# Patient Record
Sex: Male | Born: 1985 | Race: White | Hispanic: No | Marital: Single | Smoking: Current every day smoker
Health system: Southern US, Community
[De-identification: ages and names within clinical notes are randomized; demographics above are authoritative.]

## PROBLEM LIST (undated history)

## (undated) DIAGNOSIS — G43909 Migraine, unspecified, not intractable, without status migrainosus: Secondary | ICD-10-CM

---

## 2017-01-07 ENCOUNTER — Emergency Department: Payer: Self-pay

## 2017-01-07 ENCOUNTER — Encounter: Payer: Self-pay | Admitting: Emergency Medicine

## 2017-01-07 ENCOUNTER — Emergency Department
Admission: EM | Admit: 2017-01-07 | Discharge: 2017-01-07 | Disposition: A | Discharge status: Home / Self Care | Payer: Self-pay | Attending: Emergency Medicine | Admitting: Emergency Medicine

## 2017-01-07 ENCOUNTER — Other Ambulatory Visit: Payer: Self-pay

## 2017-01-07 DIAGNOSIS — R42 Dizziness and giddiness: Secondary | ICD-10-CM | POA: Insufficient documentation

## 2017-01-07 DIAGNOSIS — F17210 Nicotine dependence, cigarettes, uncomplicated: Secondary | ICD-10-CM | POA: Insufficient documentation

## 2017-01-07 DIAGNOSIS — J189 Pneumonia, unspecified organism: Secondary | ICD-10-CM | POA: Insufficient documentation

## 2017-01-07 DIAGNOSIS — R0789 Other chest pain: Secondary | ICD-10-CM

## 2017-01-07 HISTORY — DX: Migraine, unspecified, not intractable, without status migrainosus: G43.909

## 2017-01-07 LAB — BASIC METABOLIC PANEL
Anion gap: 9 (ref 5–15)
BUN: 16 mg/dL (ref 6–20)
CHLORIDE: 101 mmol/L (ref 101–111)
CO2: 28 mmol/L (ref 22–32)
CREATININE: 0.97 mg/dL (ref 0.61–1.24)
Calcium: 8.4 mg/dL — ABNORMAL LOW (ref 8.9–10.3)
GFR calc Af Amer: >60 mL/min (ref >60)
GFR calc non Af Amer: >60 mL/min (ref >60)
GLUCOSE: 76 mg/dL (ref 65–99)
Potassium: 4.1 mmol/L (ref 3.5–5.1)
SODIUM: 138 mmol/L (ref 135–145)

## 2017-01-07 LAB — CBC
HEMATOCRIT: 42.3 % (ref 40.0–52.0)
Hemoglobin: 14.6 g/dL (ref 13.0–18.0)
MCH: 30.4 pg (ref 26.0–34.0)
MCHC: 34.4 g/dL (ref 32.0–36.0)
MCV: 88.5 fL (ref 80.0–100.0)
PLATELETS: 248 10*3/uL (ref 150–440)
RBC: 4.78 MIL/uL (ref 4.40–5.90)
RDW: 13.1 % (ref 11.5–14.5)
WBC: 10 10*3/uL (ref 3.8–10.6)

## 2017-01-07 LAB — TROPONIN I
Troponin I: <0.03 ng/mL (ref <0.03)
Troponin I: <0.03 ng/mL (ref <0.03)

## 2017-01-07 LAB — GLUCOSE, CAPILLARY: Glucose-Capillary: 72 mg/dL (ref 65–99)

## 2017-01-07 LAB — FIBRIN DERIVATIVES D-DIMER (ARMC ONLY): Fibrin derivatives D-dimer (ARMC): 282.75 ng/mL (FEU) (ref 0.00–499.00)

## 2017-01-07 MED ORDER — AZITHROMYCIN 500 MG PO TABS
500.0000 mg | ORAL_TABLET | Freq: Once | ORAL | Status: AC
  Administered 2017-01-07: 500 mg via ORAL
  Filled 2017-01-07: qty 1

## 2017-01-07 MED ORDER — AZITHROMYCIN 250 MG PO TABS: ORAL_TABLET | ORAL | 0 refills | Status: AC

## 2017-01-07 MED ORDER — NAPROXEN 500 MG PO TABS: 500.0000 mg | ORAL_TABLET | Freq: Two times a day (BID) | ORAL | 0 refills | Status: AC

## 2017-01-07 MED ORDER — SODIUM CHLORIDE 0.9 % IV BOLUS (SEPSIS)
1000.0000 mL | Freq: Once | INTRAVENOUS | Status: AC
  Administered 2017-01-07: 1000 mL via INTRAVENOUS

## 2017-01-07 MED ORDER — KETOROLAC TROMETHAMINE 30 MG/ML IJ SOLN
15.0000 mg | INTRAMUSCULAR | Status: AC
  Administered 2017-01-07: 15 mg via INTRAVENOUS
  Filled 2017-01-07: qty 1

## 2017-01-07 NOTE — ED Triage Notes (Signed)
Pt to ED stating that 4 days ago he noticed that he was feeling lightheaded and more fatigue than normal. 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 Pt states that for the last 2 days he has had chest pain that comes and goes, yesterday he noticed that the pain was radiating into his left arm. Pt also reports feeling nauseated earlier today.

## 2017-01-07 NOTE — ED Provider Notes (Signed)
[facility] Emergency Department Provider Note  ____________________________________________  Time seen: Approximately 7:15 PM  I have reviewed the triage vital signs and the nursing notes.   HISTORY  Chief Complaint Dizziness and Chest Pain    HPI Isaac LaudDerrick J Leyda is a 31 y.o. male who complains of feeling lightheaded and fatigued for the past 3-4 days. He also has intermittent aching left chest pain sometimes radiates to his left arm and not consistently. Denies shortness of breath vomiting or diaphoresis. Not exertional, not pleuritic. No significant past medical history. No prior trauma hospitalization surgery or DVT or PE. 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     Past Medical History:  Diagnosis Date  . Migraines      There are no active problems to display for this patient.    History reviewed. No pertinent surgical history.   Prior to Admission medications   Medication Sig Start Date End Date Taking? Authorizing Provider  azithromycin (ZITHROMAX Z-PAK) 250 MG tablet Take 2 tablets (500 mg) on  Day 1,  followed by 1 tablet (250 mg) once daily on Days 2 through 5. 01/07/17   Sharman CheekStafford, Saumya Hukill, MD  naproxen (NAPROSYN) 500 MG tablet Take 1 tablet (500 mg total) by mouth 2 (two) times daily with a meal. 01/07/17   Sharman CheekStafford, Sargon Scouten, MD     Allergies Patient has no known allergies.   No family history on file.  Social History Social History   Tobacco Use  . Smoking status: Current Every Day Smoker    Packs/day: 1.00    Types: Cigarettes  . Smokeless tobacco: Never Used  Substance Use Topics  . Alcohol use: No    Frequency: Never  . Drug use: No    Review of Systems  Constitutional:   No fever or chills.  ENT:   No sore throat. No rhinorrhea. Cardiovascular:   Positive as above chest pain or syncope. Respiratory:   No dyspnea positive nonproductive  cough. Gastrointestinal:   Negative for abdominal pain, vomiting and diarrhea.  Musculoskeletal:   Negative for focal pain or swelling All other systems reviewed and are negative except as documented above in ROS and HPI.  ____________________________________________   PHYSICAL EXAM:  VITAL SIGNS: ED Triage Vitals  Enc Vitals Group     BP 01/07/17 1706 112/89     Pulse Rate 01/07/17 1705 (!) 109     Resp 01/07/17 1705 16     Temp 01/07/17 1705 98.7 F (37.1 C)     Temp Source 01/07/17 1705 Oral     SpO2 01/07/17 1705 99 %     Weight 01/07/17 1706 200 lb (90.7 kg)     Height 01/07/17 1706 6' (1.829 m)     Head Circumference --      Peak Flow --      Pain Score 01/07/17 1704 7     Pain Loc --      Pain Edu? --      Excl. in GC? --     Vital signs reviewed, nursing assessments reviewed.   Constitutional:   Alert and oriented. Well appearing and in no distress. Eyes:   No scleral icterus.  EOMI. No nystagmus. No conjunctival pallor. PERRL. ENT   Head:   Normocephalic and atraumatic.   Nose:   No congestion/rhinnorhea.    Mouth/Throat:   MMM, no pharyngeal erythema. No peritonsillar mass.    Neck:   No meningismus. Full  ROM. Hematological/Lymphatic/Immunilogical:   No cervical lymphadenopathy. Cardiovascular:   RRR. Symmetric bilateral radial and DP pulses.  No murmurs.  Respiratory:   Normal respiratory effort without tachypnea/retractions. Breath sounds are clear and equal bilaterally. No wheezes/rales/rhonchi. Gastrointestinal:   Soft and nontender. Non distended. There is no CVA tenderness.  No rebound, rigidity, or guarding. Genitourinary:   deferred Musculoskeletal:   Normal range of motion in all extremities. No joint effusions.  No lower extremity tenderness.  No edema. Neurologic:   Normal speech and language.  Motor grossly intact. No gross focal neurologic deficits are appreciated.  Skin:    Skin is warm, dry and intact. No rash noted.  No  petechiae, purpura, or bullae.  ____________________________________________    LABS (pertinent positives/negatives) (all labs ordered are listed, but only abnormal results are displayed) Labs Reviewed  BASIC METABOLIC PANEL - Abnormal; Notable for the following components:      Result Value   Calcium 8.4 (*)    All other components within normal limits  CBC  TROPONIN I  FIBRIN DERIVATIVES D-DIMER ([facility] ONLY)  GLUCOSE, CAPILLARY  URINALYSIS, COMPLETE (UACMP) WITH MICROSCOPIC  CBG MONITORING, ED   ____________________________________________   EKG  Interpreted by me Sinus tachycardia rate of 102, normal axis and intervals. Normal QRS. PR depression in multiple leads suggestive of pericarditis. S1Q3T3 pattern suggestive of right heart strain. No acute ischemic changes.  Repeat EKG performed 30 seconds after initial, unchanged from previous, sinus tachycardia rate 108. ____________________________________________    RADIOLOGY  Dg Chest 2 View  Result Date: 01/07/2017 CLINICAL DATA:  Cough with chest pain EXAM: CHEST  2 VIEW COMPARISON:  None. FINDINGS: Mild bronchitic changes. No effusion. Normal heart size. Small opacity at the lingula. No pneumothorax. IMPRESSION: Mild bronchitic changes. Small lingular opacity may reflect atelectasis or a small infiltrate. Electronically Signed   By: Jasmine PangKim  Fujinaga M.D.   On: 01/07/2017 18:38    ____________________________________________   PROCEDURES Procedures  ____________________________________________   CLINICAL IMPRESSION / ASSESSMENT AND PLAN / ED COURSE  Pertinent labs & imaging results that were available during my care of the patient were reviewed by me and considered in my medical decision making (see chart for details).   Patient well-appearing no acute distress, presents with atypical chest pain.Considering the patient's symptoms, medical history, and physical examination today, I have low suspicion for ACS, PE, TAD,  pneumothorax, carditis, mediastinitis, CHF, or sepsis.  Tachycardia and long travel do raise concern for PE, but his symptoms are atypical. D-dimer negative. No further workup indicated at this time. Initial troponin negative, all check a delta troponin given the somewhat peculiar findings on his EKG. If this is negative I think patient is suitable for outpatient follow-up. Likely that his symptoms are due to viral illness, some degree of dehydration. Chest x-ray suggests a lingular infiltrate so I will prescribe azithromycin as well.  His presentation is not consistent with clinically significant pericarditis or myocarditis.  Clinical Course as of Jan 08 2140  Sat Jan 07, 2017  2042 Delta trop negative. Repeat EKG unchanged. Presentation not c/w ACS. Ekg not c/w brugada or underlying propensity to dysrhythmia. F/u pcp. Zpak.   [PS]    Clinical Course User Index [PS] Sharman CheekStafford, Jaze Rodino, MD     ____________________________________________   FINAL CLINICAL IMPRESSION(S) / ED DIAGNOSES    Final diagnoses:  Atypical chest pain  Dizziness  Community acquired pneumonia of left lung, unspecified part of lung      This SmartLink is deprecated. Use AVSMEDLIST  instead to display the medication list for a patient.   Portions of this note were generated with dragon dictation software. Dictation errors may occur despite best attempts at proofreading.    Sharman Cheek, MD 01/07/17 2141

## 2017-01-07 NOTE — Discharge Instructions (Signed)
Your chest xray shows a small area of developing pneumonia on the left lung. Your lab tests were all unremarkable. Take azithromycin as prescribed to treat this infection. Follow up with your primary care doctor for continued monitoring of your symptoms.

## 2017-01-07 NOTE — ED Notes (Signed)
Urged patient to void. 

## 2017-01-07 NOTE — ED Notes (Signed)
ED Provider at bedside. 

## 2018-07-20 IMAGING — CR DG CHEST 2V
2 series · 2 of 2 positions shown · non-contrast
Comparison: None.

CLINICAL DATA: Cough with chest pain

EXAM:
CHEST  2 VIEW

[chest pa]
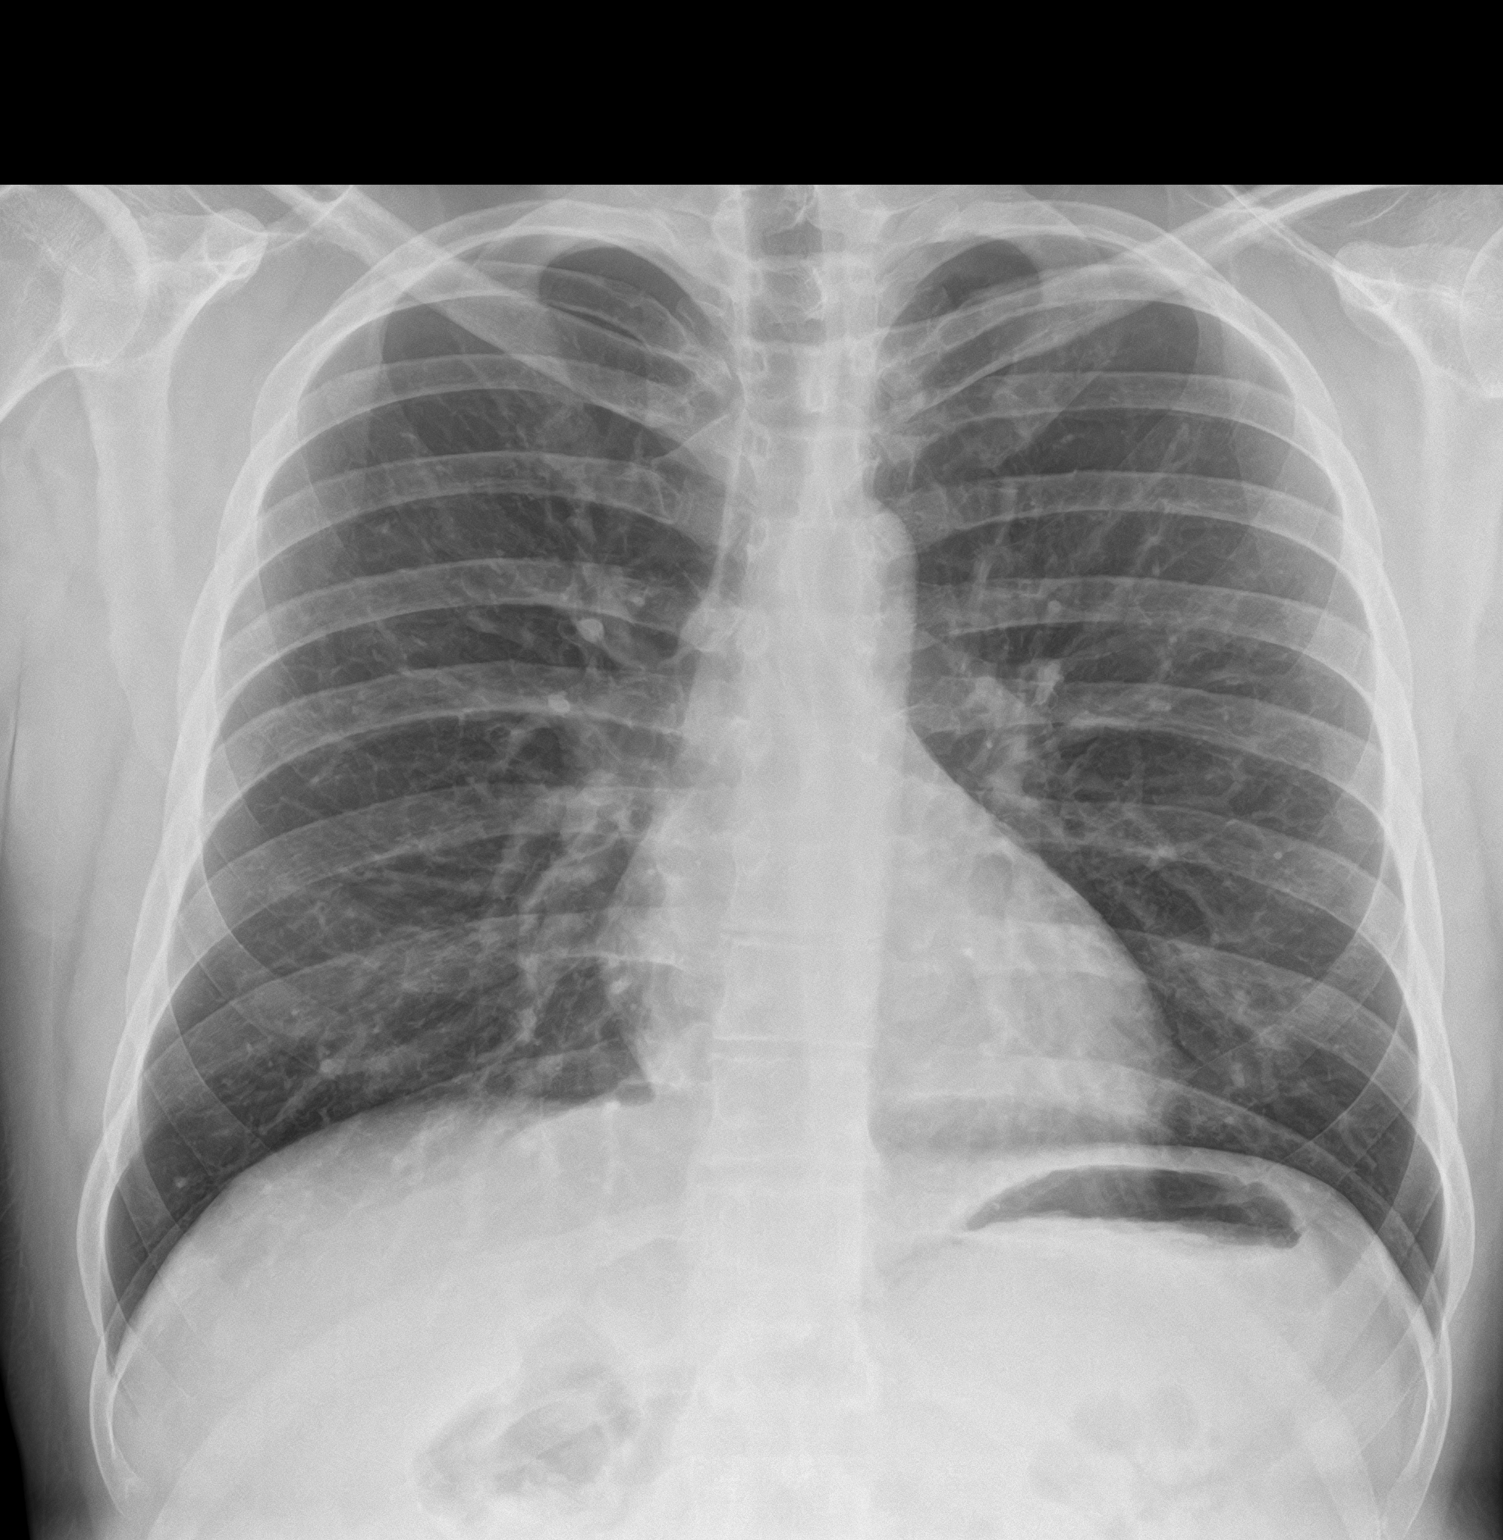

[chest lat]
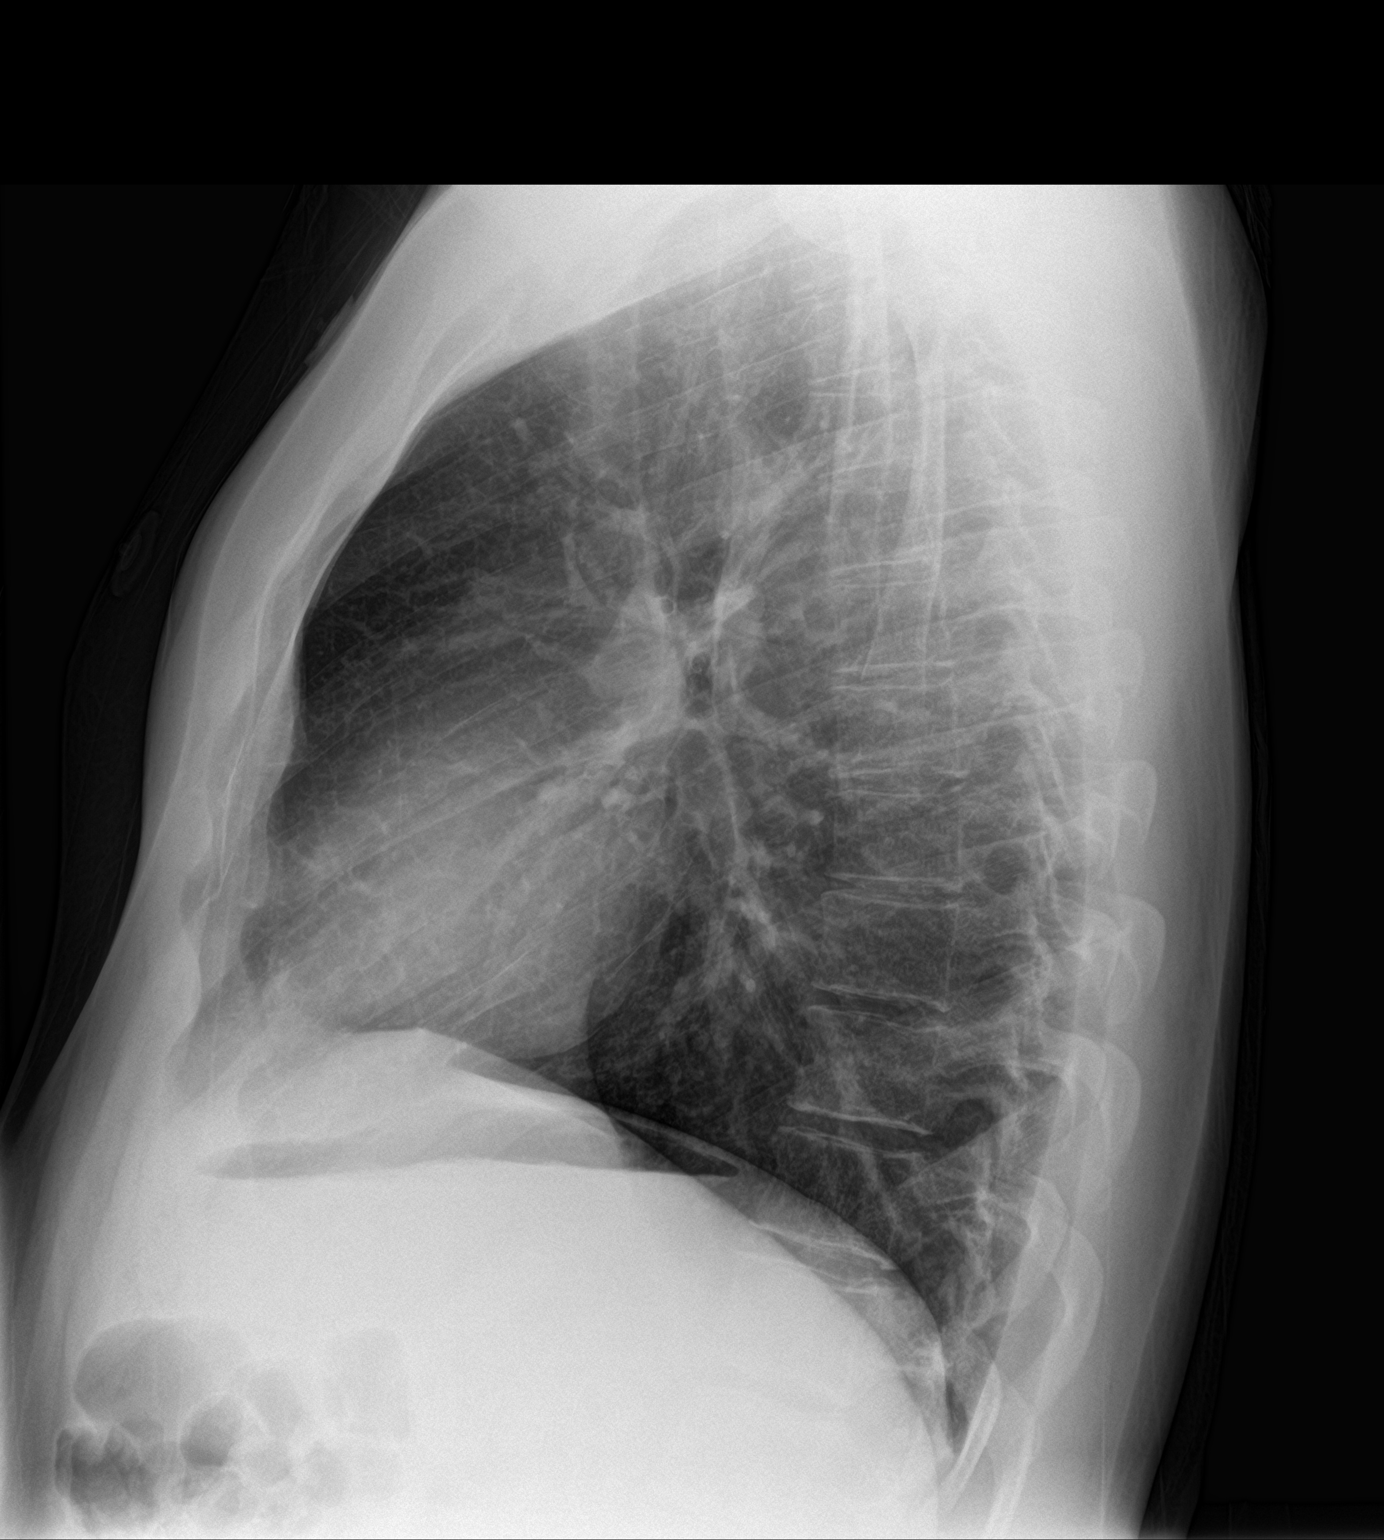

[2 of 2 positions shown; findings below may reference images not displayed]

FINDINGS: Mild bronchitic changes. No effusion. Normal heart size. Small
opacity at the lingula. No pneumothorax.
IMPRESSION: Mild bronchitic changes. Small lingular opacity may reflect
atelectasis or a small infiltrate.
